# Patient Record
Sex: Female | Born: 1960 | Hispanic: No | Marital: Married | Smoking: Never smoker
Health system: Southern US, Community
[De-identification: ages and names within clinical notes are randomized; demographics above are authoritative.]

## PROBLEM LIST (undated history)

## (undated) HISTORY — PX: ECTOPIC PREGNANCY SURGERY: SHX613

## (undated) HISTORY — PX: TUBAL LIGATION: SHX77

---

## 2016-03-24 ENCOUNTER — Emergency Department: Payer: PRIVATE HEALTH INSURANCE

## 2016-03-24 ENCOUNTER — Encounter: Payer: Self-pay | Admitting: Emergency Medicine

## 2016-03-24 ENCOUNTER — Emergency Department
Admission: EM | Admit: 2016-03-24 | Discharge: 2016-03-24 | Disposition: A | Discharge status: Home / Self Care | Payer: PRIVATE HEALTH INSURANCE | Attending: Student in an Organized Health Care Education/Training Program | Admitting: Student in an Organized Health Care Education/Training Program

## 2016-03-24 DIAGNOSIS — R05 Cough: Secondary | ICD-10-CM | POA: Diagnosis present

## 2016-03-24 DIAGNOSIS — J40 Bronchitis, not specified as acute or chronic: Secondary | ICD-10-CM | POA: Diagnosis not present

## 2016-03-24 DIAGNOSIS — R059 Cough, unspecified: Secondary | ICD-10-CM

## 2016-03-24 LAB — BASIC METABOLIC PANEL
ANION GAP: 10 (ref 5–15)
BUN: 13 mg/dL (ref 6–20)
CO2: 26 mmol/L (ref 22–32)
Calcium: 9.1 mg/dL (ref 8.9–10.3)
Chloride: 99 mmol/L — ABNORMAL LOW (ref 101–111)
Creatinine, Ser: 1.05 mg/dL — ABNORMAL HIGH (ref 0.44–1.00)
GFR calc Af Amer: >60 mL/min (ref >60)
GFR, EST NON AFRICAN AMERICAN: 59 mL/min — AB (ref >60)
GLUCOSE: 111 mg/dL — AB (ref 65–99)
POTASSIUM: 4.2 mmol/L (ref 3.5–5.1)
Sodium: 135 mmol/L (ref 135–145)

## 2016-03-24 LAB — CBC
HEMATOCRIT: 36.2 % (ref 35.0–47.0)
HEMOGLOBIN: 12.5 g/dL (ref 12.0–16.0)
MCH: 28.9 pg (ref 26.0–34.0)
MCHC: 34.5 g/dL (ref 32.0–36.0)
MCV: 84 fL (ref 80.0–100.0)
Platelets: 176 10*3/uL (ref 150–440)
RBC: 4.31 MIL/uL (ref 3.80–5.20)
RDW: 14.1 % (ref 11.5–14.5)
WBC: 3.6 10*3/uL (ref 3.6–11.0)

## 2016-03-24 LAB — TROPONIN I: Troponin I: <0.03 ng/mL (ref <0.03)

## 2016-03-24 LAB — FIBRIN DERIVATIVES D-DIMER (ARMC ONLY): FIBRIN DERIVATIVES D-DIMER (ARMC): 2904 — AB (ref 0–499)

## 2016-03-24 MED ORDER — AZITHROMYCIN 250 MG PO TABS: ORAL_TABLET | ORAL | 0 refills | Status: AC

## 2016-03-24 MED ORDER — IOPAMIDOL (ISOVUE-370) INJECTION 76%
100.0000 mL | Freq: Once | INTRAVENOUS | Status: AC | PRN
  Administered 2016-03-24: 100 mL via INTRAVENOUS

## 2016-03-24 MED ORDER — ACETAMINOPHEN 325 MG PO TABS
ORAL_TABLET | ORAL | Status: AC
  Filled 2016-03-24: qty 2

## 2016-03-24 MED ORDER — SODIUM CHLORIDE 0.9 % IV BOLUS (SEPSIS)
1000.0000 mL | Freq: Once | INTRAVENOUS | Status: AC
  Administered 2016-03-24: 1000 mL via INTRAVENOUS

## 2016-03-24 MED ORDER — ACETAMINOPHEN 325 MG PO TABS
650.0000 mg | ORAL_TABLET | Freq: Once | ORAL | Status: AC | PRN
  Administered 2016-03-24: 650 mg via ORAL

## 2016-03-24 MED ORDER — ALBUTEROL SULFATE (2.5 MG/3ML) 0.083% IN NEBU
3.0000 mL | INHALATION_SOLUTION | RESPIRATORY_TRACT | Status: DC
  Administered 2016-03-24: 3 mL via RESPIRATORY_TRACT
  Filled 2016-03-24: qty 3

## 2016-03-24 MED ORDER — ALBUTEROL SULFATE HFA 108 (90 BASE) MCG/ACT IN AERS: 2.0000 | INHALATION_SPRAY | Freq: Four times a day (QID) | RESPIRATORY_TRACT | 2 refills | Status: AC | PRN

## 2016-03-24 NOTE — ED Provider Notes (Signed)
[facility] Emergency Department Provider Note    First MD Initiated Contact with Patient 03/24/16 2037     (approximate)  I have reviewed the triage vital signs and the nursing notes.   HISTORY  Chief Complaint Cough; Weakness; and Chest Pain    HPI Yvette Cook is a 56 y.o. female from [place] with recent travel in the last month presents with 5 days of intermittent chills as well as cough and shortness of breath. Denies any lower extremity edema. She's never had symptoms like this before. She is not a smoker. Does not have a history of COPD or heart failure. Denies any nausea or vomiting. No dysuria or diarrhea.  She has no other complaints at this time.   History reviewed. No pertinent past medical history.  There are no active problems to display for this patient.   Past Surgical History:  Procedure Laterality Date  . ECTOPIC PREGNANCY SURGERY    . TUBAL LIGATION      Prior to Admission medications   Not on File    Allergies Review of patient's allergies indicates no known allergies.  No family h/o cancer or blood clots  Social History Social History  Substance Use Topics  . Smoking status: Never Smoker  . Smokeless tobacco: Never Used  . Alcohol use No    Review of Systems Patient denies headaches, rhinorrhea, blurry vision, numbness, shortness of breath, chest pain, edema, cough, abdominal pain, nausea, vomiting, diarrhea, dysuria, fevers, rashes or hallucinations unless otherwise stated above in HPI. ____________________________________________   PHYSICAL EXAM:  VITAL SIGNS: Vitals:   03/24/16 1852  BP: 99/82  Pulse: (!) 120  Resp: 20  Temp: (!) 103.1 F (39.5 C)    Constitutional: Alert and oriented. Well appearing and in no acute distress. Eyes: Conjunctivae are normal. PERRL. EOMI. Head: Atraumatic. Nose: No congestion/rhinnorhea. Mouth/Throat: Mucous membranes are moist.  Oropharynx non-erythematous. Neck:  No stridor. Painless ROM. No cervical spine tenderness to palpation Hematological/Lymphatic/Immunilogical: No cervical lymphadenopathy. Cardiovascular: Normal rate, regular rhythm. Grossly normal heart sounds.  Good peripheral circulation. Respiratory: Normal respiratory effort.  No retractions.coarse bibasilar breath sounds. Gastrointestinal: Soft and nontender. No distention. No abdominal bruits. No CVA tenderness. Genitourinary:  Musculoskeletal: No lower extremity tenderness nor edema.  No joint effusions. Neurologic:  Normal speech and language. No gross focal neurologic deficits are appreciated. No gait instability. Skin:  Skin is warm, dry and intact. No rash noted. Psychiatric: Mood and affect are normal. Speech and behavior are normal.  ____________________________________________   LABS (all labs ordered are listed, but only abnormal results are displayed)  Results for orders placed or performed during the hospital encounter of 03/24/16 (from the past 24 hour(s))  Basic metabolic panel     Status: Abnormal   Collection Time: 03/24/16  7:02 PM  Result Value Ref Range   Sodium 135 135 - 145 mmol/L   Potassium 4.2 3.5 - 5.1 mmol/L   Chloride 99 (L) 101 - 111 mmol/L   CO2 26 22 - 32 mmol/L   Glucose, Bld 111 (H) 65 - 99 mg/dL   BUN 13 6 - 20 mg/dL   Creatinine, Ser 5.85 (H) 0.44 - 1.00 mg/dL   Calcium 9.1 8.9 - 27.7 mg/dL   GFR calc non Af Amer 59 (L) >60 mL/min   GFR calc Af Amer >60 >60 mL/min   Anion gap 10 5 - 15  CBC     Status: None   Collection Time: 03/24/16  7:02 PM  Result Value Ref Range   WBC 3.6 3.6 - 11.0 K/uL   RBC 4.31 3.80 - 5.20 MIL/uL   Hemoglobin 12.5 12.0 - 16.0 g/dL   HCT 16.1 09.6 - 04.5 %   MCV 84.0 80.0 - 100.0 fL   MCH 28.9 26.0 - 34.0 pg   MCHC 34.5 32.0 - 36.0 g/dL   RDW 40.9 81.1 - 91.4 %   Platelets 176 150 - 440 K/uL  Troponin I     Status: None   Collection Time: 03/24/16  7:02 PM  Result Value Ref Range   Troponin I <0.03 <0.03  ng/mL   ____________________________________________  EKG  Time:  19:02 Indication: cough  Rate: 108  Rhythm: NSR Axis: normal Other: no acute ischemic changes ____________________________________________  RADIOLOGY  IMPRESSION: Borderline cardiomegaly.  Bibasilar atelectasis.  ____________________________________________   PROCEDURES  Procedure(s) performed: none    Critical Care performed: no ____________________________________________   INITIAL IMPRESSION / ASSESSMENT AND PLAN / ED COURSE  Pertinent labs & imaging results that were available during my care of the patient were reviewed by me and considered in my medical decision making (see chart for details).  DDX: Pneumonia, ACS, bronchitis, PE, viral URI.  Yvette Cook is a 55 y.o. who presents to the ED with chief complaint of several days of persistent cough and shortness of breath. Patient febrile and tachycardic upon arrival to the ED. Does have some coarse breath sounds on exam but chest x-ray is relatively unremarkable. She has have risk factors for pulmonary embolism given prolonged travel. Laboratory evaluation ordered a concern for acute pneumonia shows no leukocytosis. Concern for PE will order d-dimer. EKG is without signs of acute ischemia and her troponin is negative.  Clinical Course  Comment By Time  D-dimer elevated and based on her presentation will order CT scan to further risk stratify for pulmonary embolism. Willy Eddy, MD 08/04 2147   CT imaging shows no evidence of pulmonary embolism. Repeat vital signs with no fever or tachycardia. We'll provide patient with a prescription for antibiotics to treat community-acquired pneumonia as well as albuterol inhaler. Discussed signs and symptoms for which patient should return to the emergency department.  Have discussed with the patient and available family all diagnostics and treatments performed thus far and all questions were answered to the best  of my ability. The patient demonstrates understanding and agreement with plan.   ____________________________________________   FINAL CLINICAL IMPRESSION(S) / ED DIAGNOSES  Final diagnoses:  None      NEW MEDICATIONS STARTED DURING THIS VISIT:  New Prescriptions   No medications on file     Note:  This document was prepared using Dragon voice recognition software and may include unintentional dictation errors.    Willy Eddy, MD 03/25/16 [phone]

## 2016-03-24 NOTE — ED Notes (Addendum)
Patient transported to CT 

## 2016-03-24 NOTE — ED Triage Notes (Signed)
Pt comes into the ED via POV c/o cough, chest pain, headache, and weakness.  Patient is visiting from [place] with family.   Productive Cough has been going on for over 1 week.  The overall weakness started about 3 days ago but denies checking any temperatures at home.  Patient presents in NAD at this time with even and unlabored respirations.

## 2016-10-04 IMAGING — CR DG CHEST 2V
2 series · 2 of 2 positions shown · non-contrast
Comparison: None.

CLINICAL DATA: Cough, chest pain, shortness of breath and weakness.

EXAM:
CHEST  2 VIEW

[chest pa]
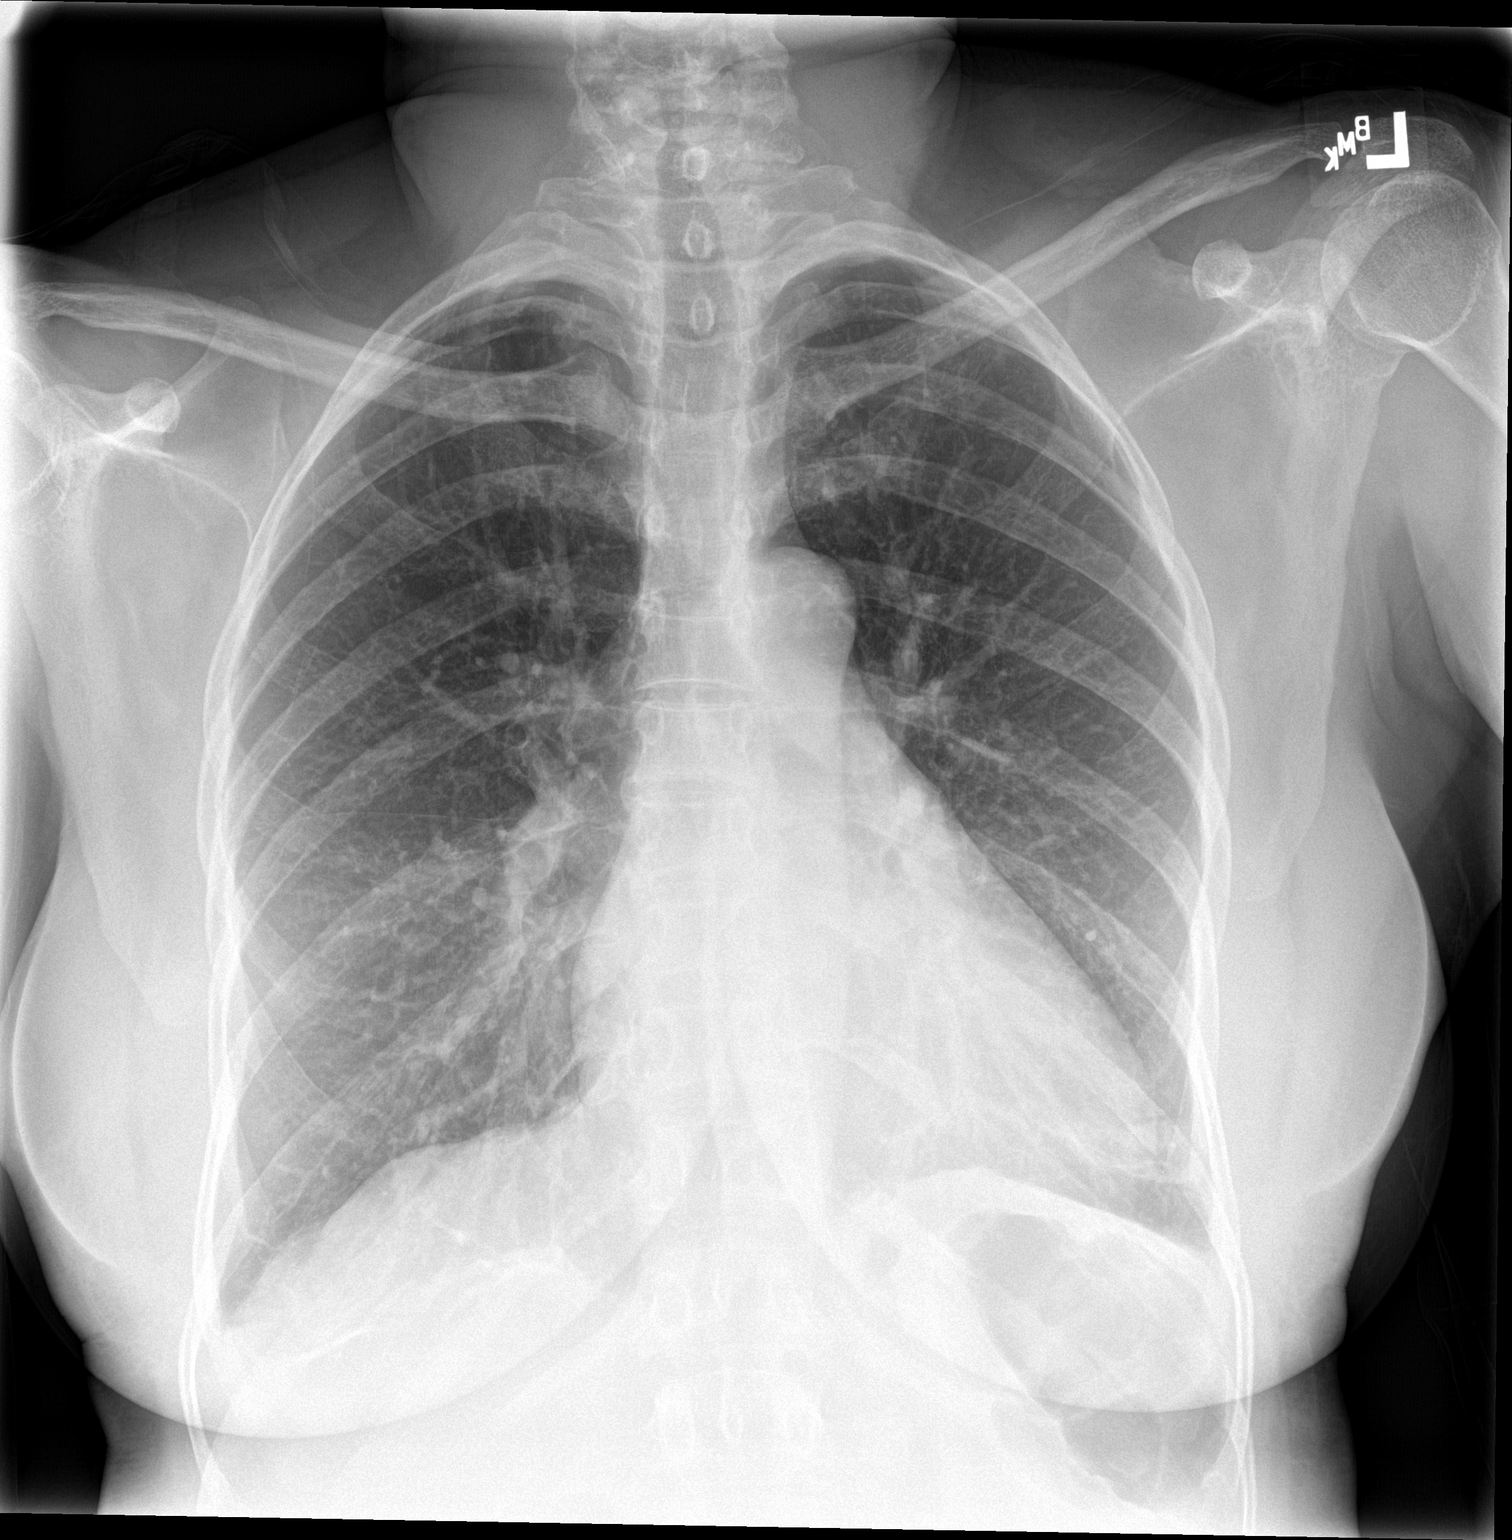

[chest lat]
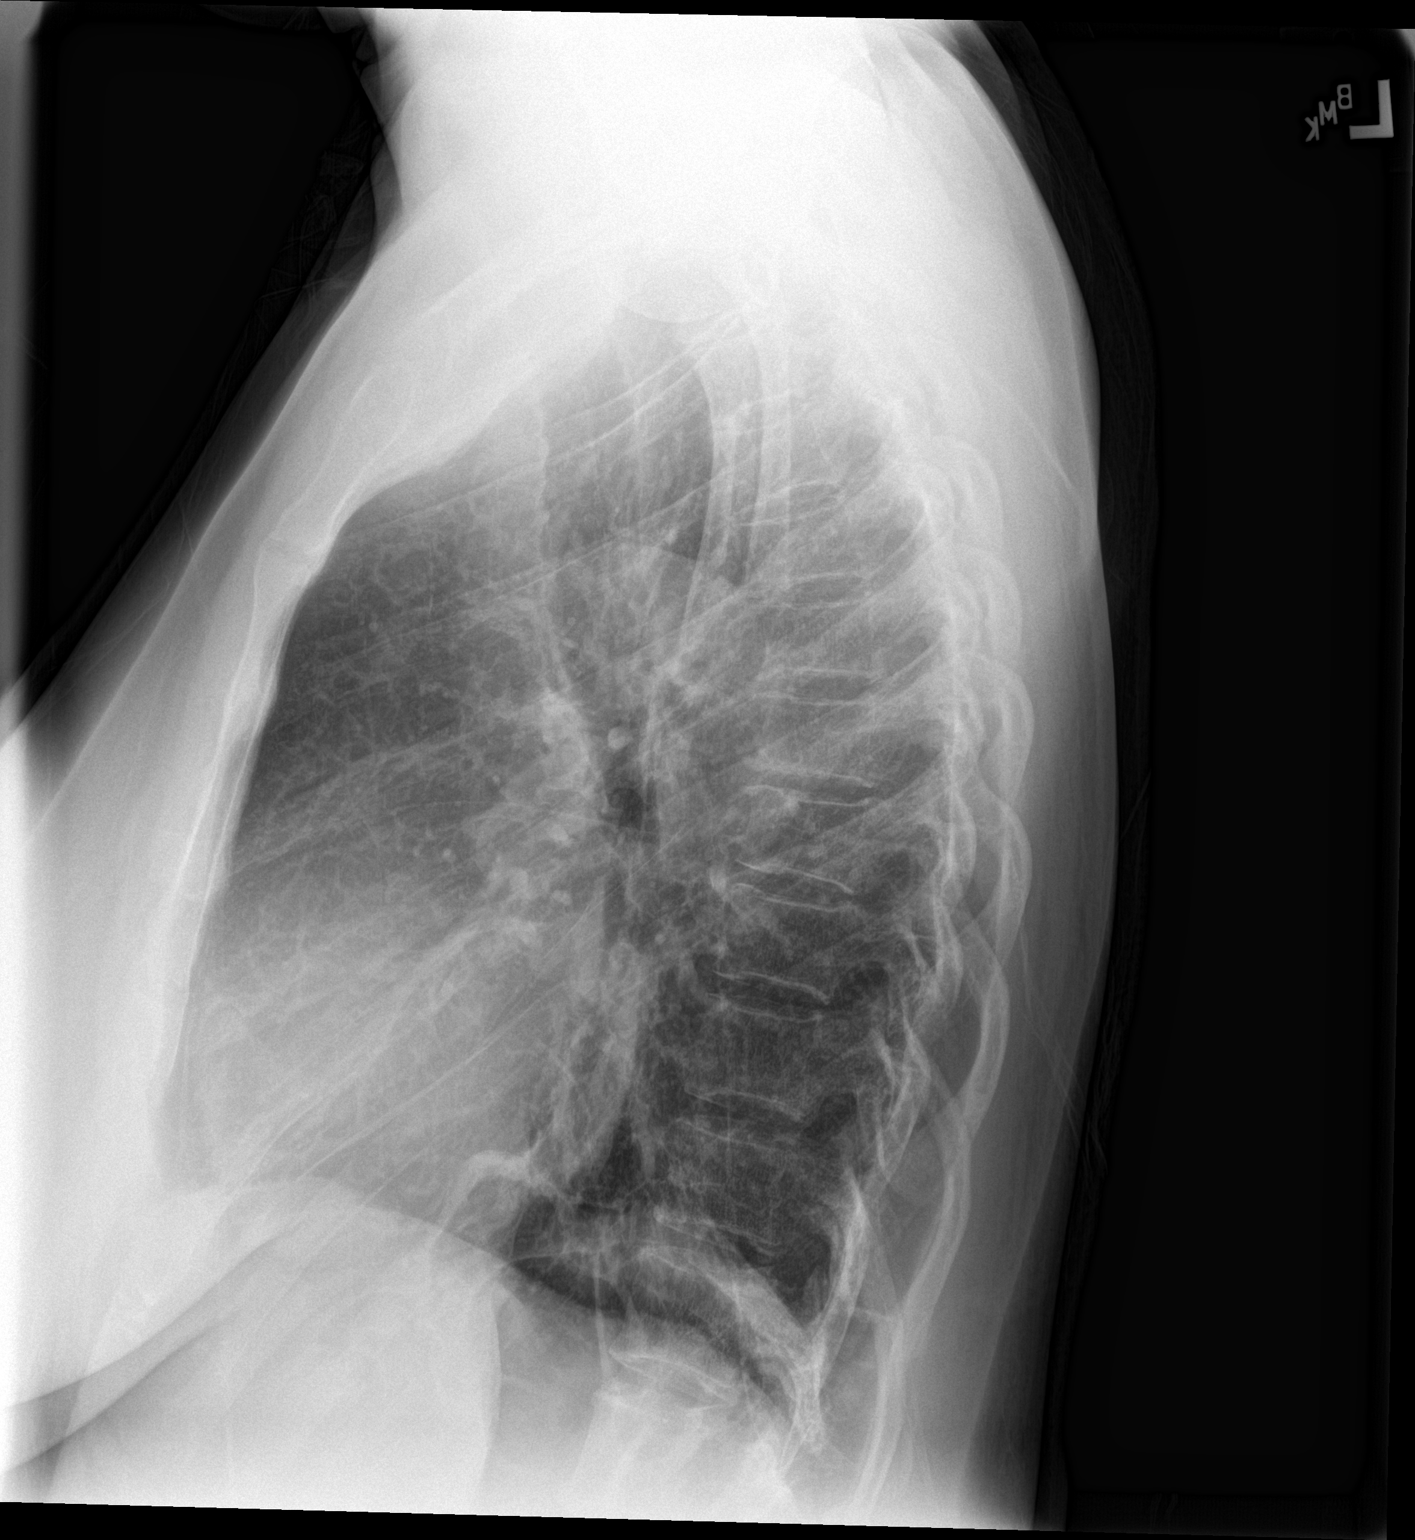

[2 of 2 positions shown; findings below may reference images not displayed]

FINDINGS: Heart is at the upper limits of normal in size. Mediastinal contours
are normal. Mild bibasilar atelectasis. Pulmonary vasculature is
normal. No consolidation, pleural effusion, or pneumothorax. No
acute osseous abnormalities are seen.
IMPRESSION: Borderline cardiomegaly.  Bibasilar atelectasis.
